# Patient Record
Sex: Male | Born: 1990 | Race: White | Hispanic: No | Marital: Single | Smoking: Current every day smoker
Health system: Southern US, Community
[De-identification: ages and names within clinical notes are randomized; demographics above are authoritative.]

## PROBLEM LIST (undated history)

## (undated) DIAGNOSIS — N4340 Spermatocele of epididymis, unspecified: Secondary | ICD-10-CM

## (undated) DIAGNOSIS — I499 Cardiac arrhythmia, unspecified: Secondary | ICD-10-CM

## (undated) HISTORY — DX: Spermatocele of epididymis, unspecified: N43.40

## (undated) HISTORY — PX: CARDIAC ELECTROPHYSIOLOGY STUDY AND ABLATION: SHX1294

---
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
## 2010-06-16 ENCOUNTER — Emergency Department (HOSPITAL_COMMUNITY): Admission: EM | Admit: 2010-06-16 | Discharge: 2010-06-16 | Payer: Self-pay | Admitting: Emergency Medicine

## 2010-07-04 ENCOUNTER — Emergency Department (HOSPITAL_COMMUNITY): Admission: EM | Admit: 2010-07-04 | Discharge: 2010-07-04 | Payer: Self-pay | Admitting: Emergency Medicine

## 2010-08-04 ENCOUNTER — Encounter (INDEPENDENT_AMBULATORY_CARE_PROVIDER_SITE_OTHER): Payer: Self-pay | Admitting: Internal Medicine

## 2010-08-04 ENCOUNTER — Ambulatory Visit: Payer: Self-pay | Admitting: Nurse Practitioner

## 2010-08-04 DIAGNOSIS — K047 Periapical abscess without sinus: Secondary | ICD-10-CM

## 2010-08-04 DIAGNOSIS — N4340 Spermatocele of epididymis, unspecified: Secondary | ICD-10-CM

## 2010-08-04 HISTORY — DX: Spermatocele of epididymis, unspecified: N43.40

## 2010-09-19 ENCOUNTER — Encounter: Payer: Self-pay | Admitting: Surgery

## 2010-09-28 NOTE — Assessment & Plan Note (Signed)
Summary: NEW PT//MC   Vital Signs:  Patient profile:   20 year old male Height:      70 inches Weight:      153.4 pounds BMI:     22.09 BSA:     1.87 Temp:     97 degrees F oral Pulse rate:   80 / minute Pulse rhythm:   regular Resp:     20 per minute BP sitting:   122 / 70  (left arm) Cuff size:   regular  Vitals Entered By: Gaylyn Cheers RN (August 04, 2010 11:00 AM) CC: here to establish, has been to ER twice for infected tooth rt lower molar, jaw cracking causing pain worse when he is eating Is Patient Diabetic? No Pain Assessment Patient in pain? yes     Location: mouth Intensity: 7 Type: aching Onset of pain  Constant  Does patient need assistance? Functional Status Self care Ambulation Normal   CC:  here to establish, has been to ER twice for infected tooth rt lower molar, and jaw cracking causing pain worse when he is eating.  History of Present Illness: 20 yo male here to establish.  Concerns:  1.  Tooth pain:  Right upper jaw.  States has been to ED twice and treated with PCn, then Clindamycin.  Has a lot of facial swelling with this and fever.  Gets better with abx, but then the swelling and pain recur.  States he is starting to feel pain and swelling again since last treatment about 1 month ago.  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  1999:  PS[facility] ablation at [facility]  Family History: Mother, 40:  Depression, Polysubstance abuse. Father, 79:  Heatlhy as far as pt. knows Sister, 7:  Heathly No children  Social History: Lives with Maternal aunt, Worthy Rancher, her husband and 2 children.  No Significant other Not employed Not sexually active  Mother Judie Bonus) was discharged from our practice  for polysubstance abuse & drug seeking - she is also living with them now that she is out of jail; Smokers in home.   Enjoys Nintendo, skateboarding Tobacco:  started age 34.  1ppd Alcohol:  Once every 2 months--never to excess Drug:  No  Physical Exam  General:  NAD Eyes:  No corneal or conjunctival inflammation noted. EOMI. Perrla. Funduscopic exam benign, without hemorrhages, exudates or papilledema. Vision grossly normal. Ears:  External ear exam shows no significant lesions or deformities.  Otoscopic examination reveals clear canals, tympanic membranes are intact bilaterally without bulging, retraction, inflammation or discharge. Hearing is grossly normal bilaterally. Mouth:  Significant dental decay diffusely--most of molars with quite large cavities.  Teeth broken off at gumline on right, both upper and lower or with deep cavities.  Gingiva swollen and red diffusely. Neck:  No deformities, masses, or tenderness noted. Lungs:  Normal respiratory effort, chest expands symmetrically. Lungs are clear to auscultation, no crackles or wheezes. Heart:  Normal rate and regular rhythm. S1 and S2 normal without gallop, murmur, click, rub or other extra sounds.   Impression & Recommendations:  Problem # 1:  ABSCESS, TOOTH (ICD-522.5) Clindamycin--this worked best for pt. 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 1 cap by  mouth three times a day for 10 days  Patient Instructions: 1)  Referral to Dentist--abscessed tooth 2)  Follow up with Dr. Delrae Alfred in 4 months --CPE 3)  Ibuprofen for pain 600-800 mg every 6 hours with food Prescriptions: CLINDAMYCIN HCL 300 MG CAPS (CLINDAMYCIN HCL) 1 cap by mouth three times a day for 10 days  #30 x 0   Entered and Authorized by:   Julieanne Manson MD   Signed by:   Julieanne Manson MD on 08/04/2010   Method used:   Faxed to ...       [facility] (retail)       [address] [place], [place]  [address]       Ph: [phone]       Fax: [phone]   RxID:   [id number]    Orders Added: 1)  Dental Referral [Dentist] 2)  New Patient Level II [phone]

## 2010-09-30 NOTE — Letter (Signed)
Summary: DENTAL REFERRAL  DENTAL REFERRAL   Imported By: Arta Bruce 08/10/2010 14:10:10  _____________________________________________________________________  External Attachment:    Type:   Image     Comment:   External Document

## 2011-01-14 NOTE — Discharge Summary (Signed)
NAME:  Daryl Harrison, CUCCARO NO.:  [id number]   MEDICAL RECORD NO.:  [id number]          PATIENT TYPE:  INP   LOCATION:  6125                         FACILITY:  [facility]   PHYSICIAN:  Wayne A. Sheffield Slider, M.D.    DATE OF BIRTH:  04-Apr-1991   DATE OF ADMISSION:  12/23/2004  DATE OF DISCHARGE:  12/24/2004                                 DISCHARGE SUMMARY   DISCHARGE MEDICATIONS:  None.   DISCHARGE DIAGNOSIS:  Pneumothorax.   BRIEF HISTORY AND HOSPITAL COURSE:  This is a 20 year old white male with  history of pediatric cardiology procedures (records not available, possible  Wolff-Parkinson-White at age 79-24 years old) came to the ER complaining of  shortness of breath and chest pain in the left upper hemithorax during the  morning of April 27.  The patient also complained of left shoulder pain.  The patient felt that he was having a heart attack.  Pain and shortness of  breath decreased gradually, while he was in the emergency room.  By the time  of interview, the patient was completely free of chest pain, shortness of  breath, or cough.  The patient was comfortable on room air.  The patient  denies previous history of pneumothorax.   PHYSICAL EXAMINATION:  VITALS:  Temperature 98.2, respiratory rate 18, pulse  91, blood pressure 108/63, O2 saturation 99% on room air.  GENERAL:  The patient was in no acute distress, comfortable.  CARDIOVASCULAR SYSTEM:  Unremarkable.  RESPIRATORY SYSTEM:  Clear to auscultation bilaterally, with good air  movement in all fields.  No retractions or symptoms of respiratory distress.  Physical examination was otherwise unremarkable.   LABORATORY DATA:  Chest x-ray showed less than 10% left apical pneumothorax.  Urine drug screen was negative, alcohol level less than 5.  Sodium 138,  potassium 3.3, chloride 101, bicarbonate 27.9, BUN 9, creatinine 0.9,  glucose 126.  Venous blood gases:  pH 7.447, PCO2 40.  Hemoglobin 17.3,  hematocrit 50.1.   PROBLEM LIST:  1.  Pneumothorax.  The patient was stable on admission.  CVTS was consulted,      who advised to admit the patient for observation.  A repeat a chest x-      ray in six hours showed a slight interval increase in the left-sided      pneumothorax which is in the 10-15% range.  On April 28 a.m., a repeat      chest x-ray showed a small stable left apical pneumothorax.  The patient      was clinically stable.  No shortness of breath, chest pain, O2      saturation was 98% on room air.  CVTS and teaching service agree with      discharge home for this patient with followup in one week by Dr. Laneta Simmers      (CVTS).  The patient will avoid exercise or strenuous activity.  The      patient will also follow up within two to three weeks with Dr. Leitha Schuller      at [facility] (PCP).  A follow up chest x-ray  will be      requested by Dr. Laneta Simmers during the next appointment on Jan 04, 2005.   1.  Social.  This acute presentation of chest pain and shortness of breath      secondary to pneumothorax occurred to this patient while he was smoking      marijuana.  The patient admitted to smoking marijuana for two to three      years now.  Per the patient, he uses maybe once a week.  The patient      also admitted to use Ritalin some times.  The patient denies alcohol      abuse or smoking.  Social worker was consulted.  This patient has told      me and the social worker that he has decided to quit smoking marijuana,      since after this acute episode he realized how dangerous this could be.      Although this patient refuses any therapy or support for quitting      marijuana.  The patient states he will do it by himself.  Social worker      has to provide information to the mother in case that the patient      changes his mind and agrees to receive support.   DISCHARGE CONDITION:  Stable.   CONSULTS:  CVTS (Dr. Laneta Simmers).   FOLLOWUP:  1.  Dr. Laneta Simmers on Jan 04, 2005 at 11:15 a.m.  at [address]  2.  Dr. Leitha Schuller at [facility] on May 16 at 12:15 p.m.      FIM/MEDQ  D:  12/24/2004  T:  12/25/2004  Job:  [id number]   cc:   Evelene Croon, M.D.  [address]  [place]  [place] [address]  Fax: [phone]   Franchot Mimes, MD  Fax: [phone]

## 2011-01-14 NOTE — H&P (Signed)
NAMEMARKY, BURESH NO.:  [id number]   MEDICAL RECORD NO.:  [id number]          PATIENT TYPE:  EMS   LOCATION:  MAJO                         FACILITY:  [facility]   PHYSICIAN:  Dwana Curd. Para March, M.D. DATE OF BIRTH:  1991-07-07   DATE OF ADMISSION:  12/23/2004  DATE OF DISCHARGE:                                HISTORY & PHYSICAL   CHIEF COMPLAINT:  Shortness of breath.   HISTORY OF PRESENT ILLNESS:  The patient is a 20 year old white male who had  sudden onset of shortness of breath and chest pain in the left upper  hemithorax this a.m. at about 7:30.  He also noted left shoulder pain.  The  patient said it felt like I was having a heart attack.  He notified his  mother, and he was then transported to [facility]  Emergency Room via EMS.  The pain and shortness of breath decreased  gradually while he was in the emergency room, and at the time of the  interview he was almost completely free of chest pain.  He was having no  shortness of breath and no cough.  The patient was comfortable on room air.  He had no previous history of a pneumothorax.   PAST MEDICAL HISTORY:  Notable for a history of a pediatric cardiologic  procedure.  Records are not available at the time of dictation.  The patient  is unsure of the name of the procedure.  However, it sounds like the patient  had been followed by Dr. Candis Musa and had had an ablation/catheterization for  Wolff-Parkinson-White at age 49-57 years of age.  The patient was sure that it  was a tachyarrhythmia that he had previously had trouble with.  However,  since his ablation, he has not had an palpitations, syncope, or presyncope.  He has had no troubles that he knows of.   SOCIAL HISTORY:  He lives with his mother and his grandmother.  His mother  is recently home from jail.  His father is in jail currently.  The patient  has one sister.  She lives with his aunt.  He is in ninth grade.  He is not  doing well  in school.  He does use marijuana.  He admitted to his last use  on December 16, 2004.  He has also used friends' Ritalin on occasion.  He is  not using alcohol.  He is not using tobacco.  The patient enjoys  skateboarding.   FAMILY HISTORY:  Mother and father are alive and healthy.  Grandparent does  have a sibling with diabetes.   MEDICATIONS:  None.  The patient did use Ritalin approximately two weeks  ago.   ALLERGIES:  No known drug allergies.   REVIEW OF SYSTEMS:  No cough, no fevers, no chills, no rhinorrhea, no rash,  no nausea or vomiting, no diarrhea, no constipation, no dysuria, no  arthralgias, no myalgias.   PHYSICAL EXAMINATION:  VITAL SIGNS:  Temperature 98.2; respiratory rate 18;  pulse 91; blood pressure 108/63; O2 saturation 99% on room air.  GENERAL:  No apparent  distress.  Comfortable.  HEENT:  The patient is wearing pink eyeliner.  He has tympanic membrane  within normal limits bilaterally.  Pupils equal, round, and reactive to  light.  Extraocular movements intact.  Oropharynx within normal limits.  The  patient does not have any oropharyngeal erythema.  NECK:  No lymphadenopathy.  No thyromegaly.  CARDIOVASCULAR:  Regular rate and rhythm.  No murmurs, rubs, or gallops  appreciated.  The patient does have normal S1 and S2.  RESPIRATORY:  Clear to auscultation bilaterally, with good air movement in  all fields.  He does not have any retractions or signs or symptoms of  respiratory distress.  ABDOMEN:  Soft, nontender, nondistended.  Positive bowel sounds.  EXTREMITIES:  No cyanosis, clubbing, or edema.  Positive rapid capillary  refill.  SKIN:  Multiple abrasions.  The patient states that these are all from  skateboarding.  They are in various stages of healing.   LABORATORIES:  Urine drug screen negative.  Alcohol level less than 5.  I-  stat labs:  Sodium 138, potassium 3.3, chloride 101, bicarbonate 27.9, BUN  9, creatinine 0.9, glucose 126.  Venous blood  gas:  pH 7.447, pCO2 40.  Hemoglobin 17.3, hematocrit 50.1.  Chest x-ray:  Less than 10% left apical  pneumothorax demonstrated.   ASSESSMENT AND PLAN:  The patient is a 20 year old male:  1.  Pneumothorax.  The patient is stable now, and he is being admitted per      the opinion of CVTS for observation.  Repeat a chest x-ray in six hours.      If the patient is stable, we will repeat the chest x-ray again in the      morning.  In the meantime, will order continuous pulse oximetry.  He has      no need for a chest tube now, and he has no need for supplemental oxygen      right now.  If the patient remains stable overnight, and the      pneumothorax is not enlarged on either of the subsequent chest x-rays,      the patient may be discharged tomorrow with followup per CVTS.  2.  History of a cardiac disorder.  I will attempt to get old records for      this.  However, the patient appears to be stable.  3.  Social work consult, because the parents and in and out of jail.  The      patient also admits to drug use and school difficulty.  Will also get      psychological consult per Dr. Lindie Spruce.  The patient is aware of both of      these.  4.  DVT prophylaxis.  Out of bed t.i.d., with activity as tolerated.  5.  FEN/GI.  Regular diet, and Hep-lock IV.      GSD/MEDQ  D:  12/23/2004  T:  12/23/2004  Job:  [id number]

## 2011-11-23 ENCOUNTER — Emergency Department (HOSPITAL_COMMUNITY)
Admission: EM | Admit: 2011-11-23 | Discharge: 2011-11-23 | Disposition: A | Discharge status: Home / Self Care | Payer: Self-pay | Attending: Emergency Medicine | Admitting: Emergency Medicine

## 2011-11-23 ENCOUNTER — Encounter (HOSPITAL_COMMUNITY): Payer: Self-pay | Admitting: *Deleted

## 2011-11-23 ENCOUNTER — Other Ambulatory Visit: Payer: Self-pay

## 2011-11-23 DIAGNOSIS — I498 Other specified cardiac arrhythmias: Secondary | ICD-10-CM | POA: Insufficient documentation

## 2011-11-23 DIAGNOSIS — R000 Tachycardia, unspecified: Secondary | ICD-10-CM

## 2011-11-23 DIAGNOSIS — R002 Palpitations: Secondary | ICD-10-CM | POA: Insufficient documentation

## 2011-11-23 HISTORY — DX: Cardiac arrhythmia, unspecified: I49.9

## 2011-11-23 LAB — CBC
HCT: 45.7 % (ref 39.0–52.0)
Hemoglobin: 16.7 g/dL (ref 13.0–17.0)
MCH: 31.9 pg (ref 26.0–34.0)
MCHC: 36.5 g/dL — ABNORMAL HIGH (ref 30.0–36.0)
MCV: 87.2 fL (ref 78.0–100.0)
Platelets: 254 10*3/uL (ref 150–400)
RBC: 5.24 MIL/uL (ref 4.22–5.81)
RDW: 12.3 % (ref 11.5–15.5)
WBC: 13.7 10*3/uL — ABNORMAL HIGH (ref 4.0–10.5)

## 2011-11-23 LAB — BASIC METABOLIC PANEL
BUN: 11 mg/dL (ref 6–23)
CO2: 22 mEq/L (ref 19–32)
Calcium: 9 mg/dL (ref 8.4–10.5)
Chloride: 102 mEq/L (ref 96–112)
Creatinine, Ser: 0.96 mg/dL (ref 0.50–1.35)
GFR calc Af Amer: >90 mL/min (ref >90)
GFR calc non Af Amer: >90 mL/min (ref >90)
Glucose, Bld: 127 mg/dL — ABNORMAL HIGH (ref 70–99)
Potassium: 3.4 mEq/L — ABNORMAL LOW (ref 3.5–5.1)
Sodium: 139 mEq/L (ref 135–145)

## 2011-11-23 LAB — D-DIMER, QUANTITATIVE: D-Dimer, Quant: <0.22 ug/mL-FEU (ref 0.00–0.48)

## 2011-11-23 LAB — MAGNESIUM: Magnesium: 1.4 mg/dL — ABNORMAL LOW (ref 1.5–2.5)

## 2011-11-23 MED ORDER — SODIUM CHLORIDE 0.9 % IV BOLUS (SEPSIS)
2000.0000 mL | Freq: Once | INTRAVENOUS | Status: AC
  Administered 2011-11-23: 2000 mL via INTRAVENOUS

## 2011-11-23 MED ORDER — GI COCKTAIL ~~LOC~~
30.0000 mL | Freq: Once | ORAL | Status: AC
  Administered 2011-11-23: 30 mL via ORAL
  Filled 2011-11-23: qty 30

## 2011-11-23 MED ORDER — MAGNESIUM SULFATE 40 MG/ML IJ SOLN
2.0000 g | Freq: Once | INTRAMUSCULAR | Status: AC
  Administered 2011-11-23: 2 g via INTRAVENOUS
  Filled 2011-11-23: qty 50

## 2011-11-23 NOTE — Discharge Instructions (Signed)
Palpitations  A palpitation is the feeling that your heartbeat is irregular. It may also feel like your heart is beating faster than normal.  HOME CARE To help prevent palpitations:  Do not drink caffeine or alcohol.   Do not eat chocolate.   Do not smoke.   Reduce stress. Try yoga or meditation.   Swim, jogg, or walk.  GET HELP RIGHT AWAY IF:   There is chest pain.   You have shortness of breath.   You deveop a very bad headache.   You develop dizziness or fainting.   You continue to have a fast heartbeat.   The palpitations occur more often.  MAKE SURE YOU:   Understand these instructions.   Will watch your condition.   Will get help right away if you are not doing well or get worse.  Document Released: 05/24/2008 Document Revised: 08/04/2011 Document Reviewed: 05/24/2008 [facility] Patient Information 2012 [place], [place].Tachycardia, Nonspecific In adults, the heart normally beats between 60 and 100 times a minute. A heart rate over 100 is called tachycardia. When your heart beats too fast, it may not be able to pump enough blood to the rest of the body. CAUSES   Exercise or exertion.   Fever.   Pain or injury.   Infection.   Loss of fluid (dehydration).   Overactive thyroid.   Lack of red blood cells (anemia).   Anxiety.   Alcohol.   Heart arrhythmia.   Caffeine.   Tobacco products.   Diet pills.   Street drugs.   Heart disease.  SYMPTOMS  Palpitations (rapid or irregular heartbeat).   Dizziness.   Tiredness (fatigue).   Shortness of breath.  DIAGNOSIS  After an exam and taking a history, your caregiver may order:  Blood tests.   Electrocardiogram (EKG).   Heart monitor.  TREATMENT  Treatment will depend on the cause and potential for harm. It may include:  Intravenous (IV) replacement of fluids or blood.   Antidote or reversal medicines.   Changes in your present medicines.   Lifestyle changes.  HOME CARE INSTRUCTIONS     Get rest.   Drink enough water and fluids to keep your urine clear or pale yellow.   Avoid:   Caffeine.   Nicotine.   Alcohol.   Stress.   Chocolate.   Stimulants.   Only take medicine as directed by your caregiver.  SEEK IMMEDIATE MEDICAL CARE IF:   You have pain in your chest, upper arms, jaw, or neck.   You become weak, dizzy, or feel faint.   You have palpitations that will not go away.   You throw up (vomit), have diarrhea, or pass blood.   You look pale and your skin is cool and wet.  MAKE SURE YOU:   Understand these instructions.   Will watch your condition.   Will get help right away if you are not doing well or get worse.  Document Released: 09/22/2004 Document Revised: 08/04/2011 Document Reviewed: 07/26/2011 [facility] Patient Information 2012 [place], [place].  RESOURCE GUIDE  Dental Problems  Patients with Medicaid: [facility] [phone] [address]                                           [phone] [address]  [employer] Phone:  [phone]                                                  Phone:  [phone]  If unable to pay or uninsured, contact:  [facility] or [facility] to become qualified for the adult dental clinic.  Chronic Pain Problems Contact [facility]  [phone] Patients need to be referred by their primary care doctor.  Insufficient Money for Medicine Contact [employer]:  call "211" or [facility] 330-357-2079.  No Primary Care Doctor Call [facility]  [phone] Other agencies that provide inexpensive medical care    [facility]  [phone]    [facility]  [phone]    [facility]  [phone]    [facility]  [phone]    [facility]  [phone]    [facility]  [phone]  Psychological Services [facility]  [phone] [facility]  [phone] [facility]    [phone] (emergency services 7571357435)  Substance Abuse Resources [facility]  850-332-3133 [facility] 207-771-7806 [facility] [phone] Floydene Flock [phone] Residential & Outpatient Substance Abuse Program  [phone]  Abuse/Neglect [facility] Child Abuse Hotline 9094621863 [facility] Child Abuse Hotline 618-624-8595 (After Hours)  Emergency Shelter [facility] [phone]  Maternity [facility] [facility] [phone] [facility] Alert Services [phone]  MRSA Hotline #:   770 245 2308    [facility] Resources  [facility] [place] [facility]     [employer]                          [facility] [address] [place]                       [address]      [address] [place] [address]  Blondell Reveal Phone:  [phone]                                   Phone:  [phone]                 Phone:  [phone]  [facility] Phone:  [phone]  [facility] Child Abuse Hotline 850 337 2507 [phone] (After Hours)

## 2011-11-23 NOTE — ED Notes (Signed)
Per EMS pt reports drinking ETOH last night, pt did not specify amt.

## 2011-11-23 NOTE — ED Notes (Signed)
Received report from [facility]. Pt is currently without any respiratory or cardiac distress. He is fully alert and oriented. Pt is not having any CP. Pt stated that he came to the ED because his heart rate started beating fast. Per pt, HR went up to 160s. Currently, HR is 120s. Pt complaining of indigestion. EDP made aware. Will continue to monitor.

## 2011-11-23 NOTE — ED Notes (Signed)
Pt in per [facility] from home pt c/o fast heart beat onset today @ 17:30, pt has hx of cardiac ablation 15 yrs ago d/t irregular heart rhythm, pt denies CP, SOB, pt denies N/V/D, pt c/o dizziness upon standing, pt c/o indigestion for a couple months

## 2011-11-23 NOTE — ED Provider Notes (Signed)
History    21 year old male palpitations. Acute onset about an hour prior to arrival. Patient has his heart is beating fast. No chest pain or shortness of breath. Patient reports an ablation at age of 49. Is unsure of specifics and neither are his parents. Has not had any issue since then. Occasionally smokes marijuana but denies any other drug use. Patient states that he drank a significant amount of alcohol last night as well. No fevers or chills. No cough. Denies history of blood clot. No unusual leg pain or swelling. Denies history of thyroid issues.   CSN: [id number]  Arrival date & time 11/23/11  1847   First MD Initiated Contact with Patient 11/23/11 1919      Chief Complaint  Patient presents with  . Irregular Heart Beat    (Consider location/radiation/quality/duration/timing/severity/associated sxs/prior treatment) HPI  Past Medical History  Diagnosis Date  . Irregular heart beat     Past Surgical History  Procedure Date  . Cardiac electrophysiology study and ablation     History reviewed. No pertinent family history.  History  Substance Use Topics  . Smoking status: Former Games developer  . Smokeless tobacco: Not on file  . Alcohol Use: Yes      Review of Systems   Review of symptoms negative unless otherwise noted in HPI.   Allergies  Review of patient's allergies indicates no known allergies.  Home Medications  No current outpatient prescriptions on file.  BP 124/90  Pulse 120  Temp(Src) 97.5 F (36.4 C) (Oral)  Resp 22  SpO2 100%  Physical Exam  Nursing note and vitals reviewed. Constitutional: He appears well-developed and well-nourished. No distress.  HENT:  Head: Normocephalic and atraumatic.  Eyes: Conjunctivae are normal. Pupils are equal, round, and reactive to light. Right eye exhibits no discharge. Left eye exhibits no discharge.  Neck: Neck supple.  Cardiovascular: Regular rhythm and normal heart sounds.  Exam reveals no gallop and no  friction rub.   No murmur heard.      Mildly tachycardic with a regular  Pulmonary/Chest: Effort normal and breath sounds normal. No respiratory distress.  Abdominal: Soft. He exhibits no distension. There is no tenderness.  Musculoskeletal: He exhibits no edema and no tenderness.  Neurological: He is alert.  Skin: Skin is warm and dry.  Psychiatric: He has a normal mood and affect. His behavior is normal. Thought content normal.      ED Course  Procedures (including critical care time)  Labs Reviewed  CBC - Abnormal; Notable for the following:    WBC 13.7 (*)    MCHC 36.5 (*)    All other components within normal limits  BASIC METABOLIC PANEL - Abnormal; Notable for the following:    Potassium 3.4 (*)    Glucose, Bld 127 (*)    All other components within normal limits  MAGNESIUM - Abnormal; Notable for the following:    Magnesium 1.4 (*)    All other components within normal limits  TSH  D-DIMER, QUANTITATIVE  LAB REPORT - SCANNED   No results found.   1. Palpitations   2. Sinus tachycardia       MDM  21 year old male palpitations. Patient with sinus tachycardia in the 100s to 120s. Possibly related to dehydration given significant alcohol ingestion last night. No evidence of atrial fibrillation or other dysrhythmia on EKG, monitor or my examination. EKG with narrow complex tachycardia with associated regular atrial activity, normal PR interval and QT. Improved with IVF. Mild hypomag but doubt  related. Repleted. Return precautions dicussed. Outtp fu.        Raeford Razor, MD 11/30/11 [phone]

## 2011-11-24 LAB — TSH: TSH: 1.851 u[IU]/mL (ref 0.350–4.500)

## 2012-05-23 ENCOUNTER — Emergency Department (INDEPENDENT_AMBULATORY_CARE_PROVIDER_SITE_OTHER)
Admission: EM | Admit: 2012-05-23 | Discharge: 2012-05-23 | Disposition: A | Discharge status: Home / Self Care | Payer: Self-pay | Source: Home / Self Care | Attending: Emergency Medicine | Admitting: Emergency Medicine

## 2012-05-23 ENCOUNTER — Encounter (HOSPITAL_COMMUNITY): Payer: Self-pay | Admitting: Emergency Medicine

## 2012-05-23 DIAGNOSIS — R05 Cough: Secondary | ICD-10-CM

## 2012-05-23 DIAGNOSIS — R0981 Nasal congestion: Secondary | ICD-10-CM

## 2012-05-23 DIAGNOSIS — R059 Cough, unspecified: Secondary | ICD-10-CM

## 2012-05-23 DIAGNOSIS — J3489 Other specified disorders of nose and nasal sinuses: Secondary | ICD-10-CM

## 2012-05-23 DIAGNOSIS — L259 Unspecified contact dermatitis, unspecified cause: Secondary | ICD-10-CM

## 2012-05-23 MED ORDER — GUAIFENESIN-CODEINE 100-10 MG/5ML PO SYRP: 5.0000 mL | ORAL_SOLUTION | Freq: Four times a day (QID) | ORAL | Status: DC | PRN

## 2012-05-23 MED ORDER — TRIAMCINOLONE ACETONIDE 0.025 % EX OINT: TOPICAL_OINTMENT | Freq: Two times a day (BID) | CUTANEOUS | Status: DC

## 2012-05-23 NOTE — ED Notes (Signed)
Multiple complaints.  Chest congestion onset yesterday.  Reports runny nose, drainage in back of throat, and cough.  Reports cough is persistent.  Reports congestion is white-brown.  Patient also concerned for what patient reports as bumps on right hand that peel.

## 2012-05-23 NOTE — ED Provider Notes (Signed)
History     CSN: [id number]  Arrival date & time 05/23/12  4098   First MD Initiated Contact with Patient 05/23/12 [phone]      Chief Complaint  Patient presents with  . URI    (Consider location/radiation/quality/duration/timing/severity/associated sxs/prior treatment) HPI Comments: Patient presents urgent care complaining of new onset of cough, nasal congestion and mild sore throat since yesterday. Patient describes a runny and congested nose some drainage in the back of his throat with some dry cough. Have expectorated some white-brown phlegm occasionally. Patient denies any wheezing, shortness of breath, constitutional symptoms such as fevers, arthralgias myalgias or headaches.  Patient also presents with a recurrent intermittent not constant scaly eruptions on both of his hands he thinks he might be related to his work as he works with some chemicals but has had it before has some on his sole surface of both of his feet. He seemed to go away for their own. Itches at times. Patient denies any further associated symptoms with them as fevers, myalgias, anorexia, arthralgias,. Patient also denies any rashes on any other area of his body including torso back and dorsal aspect of his hands.  Patient is a 21 y.o. male presenting with URI and rash. The history is provided by the patient.  URI The primary symptoms include sore throat, cough and rash. Primary symptoms do not include fever, fatigue, headaches, nausea, vomiting, myalgias or arthralgias. The current episode started yesterday. This is a new problem.  The rash is associated with itching. The rash is not associated with blisters or weeping.   Symptoms associated with the illness include chills, sinus pressure, congestion and rhinorrhea. The illness is not associated with plugged ear sensation or facial pain.  Rash  This is a recurrent problem. The problem has not changed since onset.There has been no fever. The pain is mild. Associated  symptoms include itching. Pertinent negatives include no blisters, no pain and no weeping. He has tried nothing for the symptoms. The treatment provided no relief.    Past Medical History  Diagnosis Date  . Irregular heart beat     Past Surgical History  Procedure Date  . Cardiac electrophysiology study and ablation     No family history on file.  History  Substance Use Topics  . Smoking status: Current Every Day Smoker  . Smokeless tobacco: Not on file  . Alcohol Use: Yes      Review of Systems  Constitutional: Positive for chills. Negative for fever, diaphoresis, activity change, fatigue and unexpected weight change.  HENT: Positive for congestion, sore throat, rhinorrhea, postnasal drip and sinus pressure. Negative for neck pain and neck stiffness.   Eyes: Negative for photophobia.  Respiratory: Positive for cough.   Gastrointestinal: Negative for nausea, vomiting and diarrhea.  Musculoskeletal: Negative for myalgias and arthralgias.  Skin: Positive for itching and rash. Negative for color change and pallor.  Neurological: Negative for dizziness and headaches.    Allergies  Review of patient's allergies indicates no known allergies.  Home Medications   Current Outpatient Rx  Name Route Sig Dispense Refill  . OVER THE COUNTER MEDICATION  Cough drops    . GUAIFENESIN-CODEINE 100-10 MG/5ML PO SYRP Oral Take 5 mLs by mouth 4 (four) times daily as needed for cough or congestion. 120 mL 0  . TRIAMCINOLONE ACETONIDE 0.025 % EX OINT Topical Apply topically 2 (two) times daily. X 7 days affected  areas 30 g 0    BP 139/83  Pulse 110  Temp  98 F (36.7 C) (Oral)  Resp 18  SpO2 100%  Physical Exam  Nursing note and vitals reviewed. Constitutional: He appears well-developed and well-nourished.  HENT:  Head: Normocephalic.  Right Ear: Tympanic membrane normal.  Left Ear: Tympanic membrane normal.  Mouth/Throat: Uvula is midline, oropharynx is clear and moist and  mucous membranes are normal.  Eyes: Conjunctivae normal are normal. Right eye exhibits no discharge. Left eye exhibits no discharge.  Neck: Neck supple.  Cardiovascular: Normal rate.  Exam reveals no gallop and no friction rub.   No murmur heard. Pulmonary/Chest: Effort normal and breath sounds normal. No respiratory distress. He has no wheezes. He has no rales. He exhibits no tenderness.  Abdominal: Soft.  Skin: Skin is warm. Rash noted. No abrasion and no purpura noted. Rash is papular and urticarial. Rash is not nodular and not vesicular. No erythema.       ED Course  Procedures (including critical care time)  Labs Reviewed - No data to display No results found.   1. Cough   2. Nasal congestion   3. Contact dermatitis       MDM   Symptoms and signs consistent with early stages of a upper respiratory process, possibly viral. Patient was prescribed cervical spine guaifenesin for cough and congestion. Otherwise about symptoms and will require further evaluation for his return. Patient agree with treatment plan followup care as necessary. Also patient with a localized scaly erythematous rash consistent with contact dermatitis both hands (palmar surface)- prescription of transluminal ointment was prescribed to use limited for 7 days       Jimmie Molly, MD 05/23/12 1049

## 2016-06-09 ENCOUNTER — Encounter (HOSPITAL_COMMUNITY): Payer: Self-pay | Admitting: Medical

## 2016-06-09 ENCOUNTER — Emergency Department (HOSPITAL_COMMUNITY)
Admission: EM | Admit: 2016-06-09 | Discharge: 2016-06-09 | Disposition: A | Discharge status: Home / Self Care | Payer: Self-pay | Attending: Emergency Medicine | Admitting: Emergency Medicine

## 2016-06-09 ENCOUNTER — Encounter (HOSPITAL_COMMUNITY): Payer: Self-pay | Admitting: Emergency Medicine

## 2016-06-09 DIAGNOSIS — F17200 Nicotine dependence, unspecified, uncomplicated: Secondary | ICD-10-CM | POA: Insufficient documentation

## 2016-06-09 DIAGNOSIS — S39012A Strain of muscle, fascia and tendon of lower back, initial encounter: Secondary | ICD-10-CM | POA: Insufficient documentation

## 2016-06-09 DIAGNOSIS — Y9389 Activity, other specified: Secondary | ICD-10-CM | POA: Insufficient documentation

## 2016-06-09 DIAGNOSIS — X500XXA Overexertion from strenuous movement or load, initial encounter: Secondary | ICD-10-CM | POA: Insufficient documentation

## 2016-06-09 DIAGNOSIS — S29012A Strain of muscle and tendon of back wall of thorax, initial encounter: Secondary | ICD-10-CM

## 2016-06-09 DIAGNOSIS — Y990 Civilian activity done for income or pay: Secondary | ICD-10-CM | POA: Insufficient documentation

## 2016-06-09 DIAGNOSIS — Y9289 Other specified places as the place of occurrence of the external cause: Secondary | ICD-10-CM | POA: Insufficient documentation

## 2016-06-09 MED ORDER — CYCLOBENZAPRINE HCL 10 MG PO TABS: 10.0000 mg | ORAL_TABLET | Freq: Every evening | ORAL | 0 refills | Status: DC | PRN

## 2016-06-09 NOTE — ED Notes (Signed)
See pa assessment 

## 2016-06-09 NOTE — ED Triage Notes (Signed)
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 PT denies any numbness/ weakness in arms or legs.

## 2016-06-09 NOTE — Discharge Instructions (Signed)
Use ice or heat for 20 minutes at a time, several times a day You can get lidocaine patches at a pharmacy to apply to the area Take Ibuprofen 600mg  three times daily for the next week for pain and inflammation Take the muscle relaxer at bedtime - it makes you drowsy so do not take before driving or work

## 2016-06-09 NOTE — ED Provider Notes (Signed)
[facility]-EMERGENCY DEPT Provider Note   CSN: [id number] Arrival date & time: 06/09/16  1108   By signing my name below, I, Daryl Harrison, attest that this documentation has been prepared under the direction and in the presence of Daryl Hart, PA-C. Electronically Signed: Teofilo Harrison, ED Scribe. 06/09/2016. 11:37 AM.   History   Chief Complaint Chief Complaint  Patient presents with  . Back Pain    The history is provided by the patient. No language interpreter was used.   HPI Comments:  Daryl Harrison is a 25 y.o. male who presents to the Emergency Department complaining of constant back pain x 3 weeks. 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 Pt notes that the pain is non-radiating and is primarily located in the upper left area of his back. Pt states that when he wakes up his back feels "tight," then improves minimally during the day, and then worsens at night. Pt complains of associated difficulty sleeping. Pt has taken ibuprofen 500mg  twice daily with no relief. Pt denies PMHx of cancer, IV drug use, and unintentional weight loss. Pt also denies numbness/tingling in legs/groin, neck pain, and bowel/bladder incontinence.  Past Medical History:  Diagnosis Date  . Irregular heart beat     Patient Active Problem List   Diagnosis Date Noted  . ABSCESS, TOOTH 08/04/2010  . 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 CARDIAC ELECTROPHYSIOLOGY STUDY AND ABLATION         Home Medications    Prior to Admission medications   Medication Sig Start Date End Date Taking? Authorizing Provider  guaiFENesin-codeine (ROBITUSSIN AC) 100-10 MG/5ML syrup Take 5 mLs by mouth 4 (four) times daily as needed for cough or congestion. 05/23/12   Daryl Molly, MD  OVER THE COUNTER MEDICATION Cough drops    Historical  Provider, MD  triamcinolone (KENALOG) 0.025 % ointment Apply topically 2 (two) times daily. X 7 days affected  areas 05/23/12   Daryl Molly, MD    Family History No family history on file.  Social History Social History  Substance Use Topics  . Smoking status: Current Every Day Smoker  . Smokeless tobacco: Not on file  . Alcohol use Yes     Allergies   Review of patient's allergies indicates no known allergies.   Review of Systems Review of Systems  Constitutional:       Positive for difficulty sleeping due to pain.  Musculoskeletal: Positive for back pain. Negative for neck pain.  Neurological: Negative for numbness.     Physical Exam Updated Vital Signs BP 152/95 (BP Location: Right Arm)   Pulse 106   Temp 98.1 F (36.7 C) (Oral)   Resp 18   Ht 5\' 11"  (1.803 m)   Wt 175 lb (79.4 kg)   SpO2 99%   BMI 24.41 kg/m   Physical Exam  Constitutional: He appears well-developed and well-nourished. No distress.  HENT:  Head: Normocephalic and atraumatic.  Eyes: Conjunctivae are normal.  Cardiovascular: Normal rate.   Pulmonary/Chest: Effort normal.  Abdominal: He exhibits no distension.  Musculoskeletal:  Upper back: No rash, deformity. Point tenderness with palpation of the left upper back. Strength: 5/5 in upper and lower extremities. Gait: Normal gait    Neurological: He is alert.  Skin: Skin is warm and dry.  Psychiatric: He has a normal mood and affect.  Nursing note and vitals reviewed.    ED Treatments / Results  DIAGNOSTIC STUDIES:  Oxygen Saturation is 99% on RA, normal by my interpretation.    COORDINATION OF CARE:  11:37 AM Will give ibuprofen and lidocaine patch, recommended ice/heat treatment, and will refer to an ortho. Discussed treatment plan with pt at bedside and pt agreed to plan.   Labs (all labs ordered are listed, but only abnormal results are displayed) Labs Reviewed - No data to display  EKG  EKG Interpretation None        Radiology No results found.  Procedures Procedures (including critical care time)  Medications Ordered in ED Medications - No data to display   Initial Impression / Assessment and Plan / ED Course  I have reviewed the triage vital signs and the nursing notes.  Pertinent labs & imaging results that were available during my care of the patient were reviewed by me and considered in my medical decision making (see chart for details).  Clinical Course   25 year old male with muscle strain. Patient is ambulatory.  No loss of bowel or bladder control.  No concern for cauda equina.  No fever, night sweats, weight loss, h/o cancer, IVDA, no recent procedure to back. No acute injury. Discussed pain control with NSAIDs, ice/heat, muscle relaxer, and ortho referral. Work note given. Appears safe for discharge at this time. Follow up as indicated in discharge paperwork.    Final Clinical Impressions(s) / ED Diagnoses   Final diagnoses:  Muscle strain of left upper back, initial encounter    New Prescriptions New Prescriptions   No medications on file   I personally performed the services described in this documentation, which was scribed in my presence. The recorded information has been reviewed and is accurate.    Daryl Born, PA-C 06/09/16 1348    Daryl Porter, MD 06/17/16 1350

## 2016-06-27 ENCOUNTER — Ambulatory Visit (INDEPENDENT_AMBULATORY_CARE_PROVIDER_SITE_OTHER): Payer: Self-pay

## 2016-06-27 ENCOUNTER — Encounter (INDEPENDENT_AMBULATORY_CARE_PROVIDER_SITE_OTHER): Payer: Self-pay | Admitting: Sports Medicine

## 2016-06-27 ENCOUNTER — Ambulatory Visit (INDEPENDENT_AMBULATORY_CARE_PROVIDER_SITE_OTHER): Payer: Self-pay | Admitting: Sports Medicine

## 2016-06-27 VITALS — BP 146/87 | HR 104 | Ht 71.0 in | Wt 170.0 lb

## 2016-06-27 DIAGNOSIS — M9902 Segmental and somatic dysfunction of thoracic region: Secondary | ICD-10-CM

## 2016-06-27 DIAGNOSIS — M25512 Pain in left shoulder: Secondary | ICD-10-CM

## 2016-06-27 DIAGNOSIS — M542 Cervicalgia: Secondary | ICD-10-CM

## 2016-06-27 MED ORDER — PREDNISONE 50 MG PO TABS: 50.0000 mg | ORAL_TABLET | Freq: Every day | ORAL | 0 refills | Status: AC

## 2016-06-27 MED ORDER — GABAPENTIN 300 MG PO CAPS: ORAL_CAPSULE | ORAL | 1 refills | Status: AC

## 2016-06-27 NOTE — Progress Notes (Addendum)
Daryl Harrison - 25 y.o. male MRN [id number]  Date of birth: 09-01-1990  Office Visit Note: Visit Date: 06/27/2016 PCP: Centricity User, MD (Inactive) Referred by: No ref. provider found  Subjective: Chief Complaint  Patient presents with  . upper back    seen in Emergency Department   HPI: 6 weeks of insidious onset left periscapular pain. No known focal injury. Pain does not radiate into the left upper extremity. The pain doesn't awaken him at night on a daily basis. Pain is described as a generalized tightness. Denies any numbness or tingling. He is tried ibuprofen & Flexeril with only minimal improvement. No prior neck spine issues. Has noted some limitations in range of motion of his neck. Radiating into the left upper sternal area. Pain awakens him & night. Pain is constant. Patient  states upper back pain for over a month.  Hurts on left side of neck radiating into left shoulder blade.      ROS Otherwise per HPI.  Assessment & Plan: Visit Diagnoses:  1. Acute pain of left shoulder   2. Neck pain   3. Segmental and somatic dysfunction of thoracic region     Plan: Findings:  Osteopathic manipulation to the thoracic spine today. Care was taken to avoid manipulating cervical spine given potential for radicular component. Medications as below. [facility] back & neck conditioning program provided. He will be allowed return to work in 2 days with restriction outlined per work note. If any lack of improvement would consider further advanced imaging of the cervical spine.   Meds & Orders:  Meds ordered this encounter  Medications  . gabapentin (NEURONTIN) 300 MG capsule    Sig: Start with 1 tab po qhs X 1 week, then increase to 1 tab po bid X 1 week then 1 tab po prn    Dispense:  90 capsule    Refill:  1  . predniSONE (DELTASONE) 50 MG tablet    Sig: Take 1 tablet (50 mg total) by mouth daily with breakfast. Start 10/30    Dispense:  5 tablet    Refill:  0    Orders Placed This  Encounter  Procedures  . OSTEOPATHIC MANIPULATION TREATMENT  . XR Cervical Spine 2 or 3 views    Follow-up: Return in about 6 weeks (around 08/08/2016) for repeat clinical exam.   Procedures: No procedures performed  PROCEDURE NOTE : Osteopathic Exam & Manipulation REGION: OSTEOPATHIC EXAM FINDINGS  THORACIC: T2 - T8 F RISI and N RrSl   The decision today to treat with Osteopathic Manipulative Therapy (OMT) was based on physical exam findings. Verbal consent was obtained after after explanation of risks, benefits and potential side effects, including acute pain flare, post manipulation soreness and need for repeat treatments.  If Cervical manipulation was performed additional time was spent discussing the associated minimal risk of  injury to neurovascular structures.  After consent was obtained manipulation was performed as below:            Regions treated:  Per billing codes          Techniques used:  HVLA The patient tolerated the treatment well and reported Improved symptoms following treatment today. Patient was given medications, exercises, stretches and lifestyle modifications per AVS and verbally.     Clinical History: Works a Advertising account plannercompressed air handler  He reports that he has been smoking.  He has never used smokeless tobacco. No results for input(s): HGBA1C, LABURIC in the last 8760 hours.  Objective:  VS:  HT:5\' 11"  (180.3 cm)   WT:170 lb (77.1 kg)  BMI:23.8    BP:(!) 146/87  HR:(!) 104bpm  TEMP: ( )  RESP:  Physical Exam  Constitutional: No distress.  HENT:  Head: Normocephalic and atraumatic.  Eyes: Right eye exhibits no discharge. Left eye exhibits no discharge. No scleral icterus.  Pulmonary/Chest: Effort normal. No respiratory distress.  Neurological: He is alert.  Appropriately interactive.  Skin: Skin is warm and dry. No rash noted. He is not diaphoretic. No erythema. No pallor.  Psychiatric: Judgment normal.    Ortho Exam  Cervical spine: Limited side bending  to the left. Marked TTP over the periscapular region on the right. Negative Spurling's compression test. Upper extremity strength is 5+/5. Upper choice sensation is intact to light touch throughout all upper extremity dermatomes. Normal reflexes.  Imaging: No results found.  Past Medical/Family/Surgical/Social History: Medications & Allergies reviewed per EMR Patient Active Problem List   Diagnosis Date Noted  . 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 Irregular heart beat   . 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 Cancer Maternal Grandfather    Past Surgical History:  Procedure Laterality Date  . CARDIAC ELECTROPHYSIOLOGY STUDY AND ABLATION     Social History   Occupational History  . Not on file.   Social History Main Topics  . Smoking status: Current Every Day Smoker  . Smokeless tobacco: Never Used  . Alcohol use Yes  . Drug use: No  . Sexual activity: Not on file

## 2016-06-27 NOTE — Procedures (Signed)
PROCEDURE NOTE : Osteopathic Exam & Manipulation REGION: OSTEOPATHIC EXAM FINDINGS  THORACIC: T2 - T8 F RISI and N RrSl   The decision today to treat with Osteopathic Manipulative Therapy (OMT) was based on physical exam findings. Verbal consent was obtained after after explanation of risks, benefits and potential side effects, including acute pain flare, post manipulation soreness and need for repeat treatments.  If Cervical manipulation was performed additional time was spent discussing the associated minimal risk of  injury to neurovascular structures.  After consent was obtained manipulation was performed as below:            Regions treated:  Per billing codes          Techniques used:  HVLA The patient tolerated the treatment well and reported Improved symptoms following treatment today. Patient was given medications, exercises, stretches and lifestyle modifications per AVS and verbally.

## 2016-08-08 ENCOUNTER — Ambulatory Visit (INDEPENDENT_AMBULATORY_CARE_PROVIDER_SITE_OTHER): Payer: Self-pay | Admitting: Sports Medicine

## 2016-09-18 ENCOUNTER — Emergency Department (HOSPITAL_COMMUNITY)
Admission: EM | Admit: 2016-09-18 | Discharge: 2016-09-18 | Disposition: A | Discharge status: Home / Self Care | Payer: BLUE CROSS/BLUE SHIELD | Attending: Emergency Medicine | Admitting: Emergency Medicine

## 2016-09-18 ENCOUNTER — Encounter (HOSPITAL_COMMUNITY): Payer: Self-pay

## 2016-09-18 DIAGNOSIS — F17200 Nicotine dependence, unspecified, uncomplicated: Secondary | ICD-10-CM | POA: Insufficient documentation

## 2016-09-18 DIAGNOSIS — G8929 Other chronic pain: Secondary | ICD-10-CM | POA: Insufficient documentation

## 2016-09-18 DIAGNOSIS — Z79899 Other long term (current) drug therapy: Secondary | ICD-10-CM | POA: Diagnosis not present

## 2016-09-18 DIAGNOSIS — M546 Pain in thoracic spine: Secondary | ICD-10-CM | POA: Insufficient documentation

## 2016-09-18 DIAGNOSIS — M549 Dorsalgia, unspecified: Secondary | ICD-10-CM

## 2016-09-18 NOTE — ED Provider Notes (Signed)
[facility] DEPT Provider Note   CSN: [id number] Arrival date & time: 09/18/16  2027   By signing my name below, I, Daryl Harrison, attest that this documentation has been prepared under the direction and in the presence of Daryl Horsemanobert Jenissa Tyrell, PA-C. Electronically Signed: Clarisse GougeXavier Harrison, Scribe. 09/18/16. 9:27 PM.   History   Chief Complaint Chief Complaint  Patient presents with  . Back Pain  . Work Note   The history is provided by the patient and medical records. No language interpreter was used.    HPI Comments: Daryl Harrison is a 26 y.o. male who presents to the Emergency Department complaining of chronic upper back pain that has worsened over the past week. He states his pain radiated to his legs when he sneezed yesterday, which was a new occurrence. He states he was out of work last week, he has an appointment with an orthopedic specialist scheduled in February, and he requests a work note lasting until the appointment. He notes he originally hurt his back at work, and he reports he was diagnosed with a bone spur in the neck 05/2016 close to a nerve. Pt reports associated neck stiffness when waking. He notes he was given gabapentin during the aforementioned 05/2016 visit, but he has run out since, and he currently takes ibuprofen and tylenol with minimal relief. He states he urinates and defecates normally. Hx of tachycardia and childhood arrhythmia.  Had an ablation procedure that was performed on him as a child, reports persistent tachycardia since then. He denies numbness/ weakness.  Past Medical History:  Diagnosis Date  . Irregular heart beat   . 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 CARDIAC ELECTROPHYSIOLOGY STUDY AND ABLATION         Home Medications     Prior to Admission medications   Medication Sig Start Date End Date Taking? Authorizing Provider  gabapentin (NEURONTIN) 300 MG capsule Start with 1 tab po qhs X 1 week, then increase to 1 tab po bid X 1 week then 1 tab po prn 06/27/16   Andrena MewsMichael D Rigby, DO  predniSONE (DELTASONE) 50 MG tablet Take 1 tablet (50 mg total) by mouth daily with breakfast. Start 10/30 06/27/16   Andrena MewsMichael D Rigby, DO    Family History Family History  Problem Relation Age of Onset  . Cancer Maternal Grandfather     Social History Social History  Substance Use Topics  . Smoking status: Current Every Day Smoker  . Smokeless tobacco: Never Used  . Alcohol use Yes     Allergies   Patient has no known allergies.   Review of Systems Review of Systems  Gastrointestinal: Negative for constipation and diarrhea.  Genitourinary: Negative for decreased urine volume, enuresis, frequency and urgency.  Musculoskeletal: Positive for arthralgias, back pain and neck stiffness.  Skin: Negative for wound.  Neurological: Negative for weakness and numbness.  Psychiatric/Behavioral: Negative for confusion.     Physical Exam Updated Vital Signs BP 136/86   Pulse (!) 124   Temp 98.6 F (37 C) (Oral)   Resp 16   Ht 6' (1.829 m)   Wt 173 lb 3 oz (78.6 kg)   SpO2 98%   BMI 23.49 kg/m   Physical Exam Physical Exam  Constitutional: Pt appears  well-developed and well-nourished. No distress.  HENT:  Head: Normocephalic and atraumatic.  Mouth/Throat: Oropharynx is clear and moist. No oropharyngeal exudate.  Eyes: Conjunctivae are normal.  Neck: Normal range of motion. Neck supple.  No meningismus Cardiovascular: Normal rate, regular rhythm and intact distal pulses.   Pulmonary/Chest: Effort normal and breath sounds normal. No respiratory distress. Pt has no wheezes.  Abdominal: Pt exhibits no distension Musculoskeletal:  Moderate cervical paraspinal tender to palpation, no bony CTLS spine tenderness,  deformity, step-off, or crepitus Lymphadenopathy: Pt has no cervical adenopathy.  Neurological: Pt is alert and oriented Speech is clear and goal oriented, follows commands Normal 5/5 strength in upper and lower extremities bilaterally including dorsiflexion and plantar flexion, strong and equal grip strength Sensation intact Great toe extension intact Moves extremities without ataxia, coordination intact Normal gait Normal balance No Clonus Skin: Skin is warm and dry. No rash noted. Pt is not diaphoretic. No erythema.  Psychiatric: Pt has a normal mood and affect. Behavior is normal.  Nursing note and vitals reviewed.   ED Treatments / Results  DIAGNOSTIC STUDIES: Oxygen Saturation is 98% on RA, normal by my interpretation.    COORDINATION OF CARE: 9:27 PM Discussed treatment plan with pt at bedside and pt agreed to plan.  Labs (all labs ordered are listed, but only abnormal results are displayed) Labs Reviewed - No data to display  EKG  EKG Interpretation None       Radiology No results found.  Procedures Procedures (including critical care time)  Medications Ordered in ED Medications - No data to display   Initial Impression / Assessment and Plan / ED Course  I have reviewed the triage vital signs and the nursing notes.  Pertinent labs & imaging results that were available during my care of the patient were reviewed by me and considered in my medical decision making (see chart for details).     Patient with back pain.  No neurological deficits and normal neuro exam.  Patient is ambulatory.  No loss of bowel or bladder control.  Doubt cauda equina.  Denies fever,  doubt epidural abscess or other lesion. Recommend back exercises, stretching, RICE.  Patient reports baseline tachycardia.  In reviewing his charts in [facility], he does appear to always be tachycardic.  He states this is normal for him.  He denies any CP or SOB.    Encouraged the patient that there could  be a need for additional workup and/or imaging such as MRI, if the symptoms do not resolve. Patient advised that if the back pain does not resolve, or radiates, this could progress to more serious conditions and is encouraged to follow-up with PCP or orthopedics within 2 weeks.     Final Clinical Impressions(s) / ED Diagnoses   Final diagnoses:  Other chronic back pain    New Prescriptions New Prescriptions   No medications on file     Daryl Horseman, PA-C 09/18/16 2138    Shaune Pollack, MD 09/19/16 [phone]

## 2016-09-18 NOTE — ED Notes (Signed)
Pt states he is consistently tachycardic d/t chronic cardiac issues. Pt states he has not follow up cardiology.  Pt verbalized understanding of d/c instructions.

## 2016-09-18 NOTE — ED Triage Notes (Signed)
Pt complaining of upper mid back pain x months. Pt states hurt self at work. Pt states has appointment with ortho MD in February. Pt requesting work note until then. Pt states hx tachycardia d/t heart surgery in past. Pt denies any chest pain or SOB. Pt in NAD. Pt ambulatory at triage.

## 2016-10-03 ENCOUNTER — Encounter (INDEPENDENT_AMBULATORY_CARE_PROVIDER_SITE_OTHER): Payer: Self-pay | Admitting: Orthopaedic Surgery

## 2016-10-03 ENCOUNTER — Ambulatory Visit (INDEPENDENT_AMBULATORY_CARE_PROVIDER_SITE_OTHER): Payer: BLUE CROSS/BLUE SHIELD | Admitting: Orthopaedic Surgery

## 2016-10-03 VITALS — BP 145/88 | HR 107 | Ht 72.0 in | Wt 175.0 lb

## 2016-10-03 DIAGNOSIS — M542 Cervicalgia: Secondary | ICD-10-CM | POA: Diagnosis not present

## 2016-10-03 DIAGNOSIS — M47812 Spondylosis without myelopathy or radiculopathy, cervical region: Secondary | ICD-10-CM | POA: Diagnosis not present

## 2016-10-03 NOTE — Addendum Note (Signed)
Addended by: Rogers SeedsYEATTS, Keiondra Brookover M on: 10/03/2016 09:43 AM   Modules accepted: Orders

## 2016-10-03 NOTE — Progress Notes (Signed)
Office Visit Note   Patient: Daryl Harrison           Date of Birth: [birth year]           MRN: [id number] Visit Date: 10/03/2016              Requested by: No referring provider defined for this encounter. PCP: Lobbyist, MD (Inactive)   Assessment & Plan: Visit Diagnoses:  1. Cervicalgia   2. Spondylosis of cervical region without myelopathy or radiculopathy     Plan: Patient failed conservative treatment symptoms were present more than 4 months. He has mechanical nerve root irritation signs. We'll proceed with a cervical MRI and office follow-up after scan. We reviewed x-rays discussed pathophysiology. I reviewed Dr. Janeece Riggers previous conservative treatment. He's had ibuprofen Tylenol prednisone gabapentin without relief. Office follow-up after scan. Patient's pain is been severe enough he's not been able to work the last 2 weeks. Work slip given for no work starting 09/10/2016 and continue for 3 weeks from today so I will enough time to obtain the MRI scan approval, get the MRI scan completed and then review it with him in office follow-up. Follow-Up Instructions: No Follow-up on file.   Orders:  No orders of the defined types were placed in this encounter.  No orders of the defined types were placed in this encounter.     Procedures: No procedures performed   Clinical Data: No additional findings.   Subjective: Chief Complaint  Patient presents with  . Neck - Pain  . Left Shoulder - Pain    Patient presents with left sided neck pain that radiates down under left shoulder blade. He denies any radiculopathy down either arm. He saw Dr. Berline Chough in October and states he was diagnosed with a bone spur in his neck. The pain has just gotten worse. He states that when he wakes up, he cannot hardly move his neck. He has been out of work x 2 weeks waiting on this appointment due to pain. He is using ibuprofen/tylenol with no relief. He states that Dr. Berline Chough tried him on  Gabapentin as well which did not help his symptoms.   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 His neck bothers him on a daily basis. Patient's been on the prednisone pack without relief in October.  Review of Systems  Constitutional: Negative for chills and diaphoresis.  HENT: Negative for ear discharge, ear pain and nosebleeds.   Eyes: Negative for discharge and visual disturbance.  Respiratory: Negative for cough, choking and shortness of breath.   Cardiovascular: Negative for chest pain and palpitations.       History of tachycardia. He had an ablation done when he was about 50 or 26 years old he believes. No problems since that time. He is on no cardiac meds.  Gastrointestinal: Negative for abdominal distention and abdominal pain.  Endocrine: Negative for cold intolerance and heat intolerance.  Genitourinary: Negative for flank pain and hematuria.  Musculoskeletal:       Neck pain times greater than 4 months. Pain radiates more to the left side sometimes down his hand. Bothers him a daily basis. He's tried anti-inflammatories, he's been on Neurontin without relief. He states gradually in the last 2 months his symptoms of gotten worse.  Skin: Negative for rash and wound.  Neurological: Negative  for seizures and speech difficulty.  Hematological: Negative for adenopathy. Does not bruise/bleed easily.  Psychiatric/Behavioral: Negative for agitation and suicidal ideas.   Family history: Patient several family members who've had back problems neck problems.  Objective: Vital Signs: BP (!) 145/88   Pulse (!) 107   Ht 6' (1.829 m)   Wt 175 lb (79.4 kg)   BMI 23.73 kg/m   Physical Exam  Constitutional: He is oriented to person, place, and time. He appears well-developed and well-nourished.  HENT:  Head: Normocephalic and atraumatic.    Eyes: EOM are normal. Pupils are equal, round, and reactive to light.  Neck: No tracheal deviation present. No thyromegaly present.  Cardiovascular: Normal rate.   Pulmonary/Chest: Effort normal. He has no wheezes.  Abdominal: Soft. Bowel sounds are normal.  Musculoskeletal:  Patient has a significant brachial plexus tenderness positive spurring on the left negative Lhermitte he has pain with full flexion tendon chest increased pain with compression. Upper extremity reflexes biceps triceps brachioradialis are 1+ and symmetrical. Negative impingement right and left shoulders. No isolated motor weakness biceps triceps, wrist flexion extension and grip. No lower extremity hyperreflexia.  Neurological: He is alert and oriented to person, place, and time.  Skin: Skin is warm and dry. Capillary refill takes less than 2 seconds.  Psychiatric: He has a normal mood and affect. His behavior is normal. Judgment and thought content normal.    Ortho Exam  Specialty Comments:  Works a Advertising account plannercompressed air handler  Imaging: No results found.   PMFS History: Patient Active Problem List   Diagnosis Date Noted  . 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 Irregular heart beat   . 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 Cancer Maternal Grandfather     Past Surgical History:  Procedure Laterality Date  . CARDIAC ELECTROPHYSIOLOGY STUDY AND ABLATION     Social History   Occupational History  . Not on file.   Social History Main Topics  . Smoking status: Current Every Day Smoker  . Smokeless tobacco: Never Used  . Alcohol use Yes  . Drug use: No  . Sexual activity: Not on file

## 2016-10-05 ENCOUNTER — Ambulatory Visit (INDEPENDENT_AMBULATORY_CARE_PROVIDER_SITE_OTHER): Payer: Self-pay | Admitting: Orthopaedic Surgery

## 2016-10-07 ENCOUNTER — Ambulatory Visit
Admission: RE | Admit: 2016-10-07 | Discharge: 2016-10-07 | Disposition: A | Discharge status: Home / Self Care | Payer: BLUE CROSS/BLUE SHIELD | Source: Ambulatory Visit | Attending: Orthopaedic Surgery | Admitting: Orthopaedic Surgery

## 2016-10-07 DIAGNOSIS — M47812 Spondylosis without myelopathy or radiculopathy, cervical region: Secondary | ICD-10-CM

## 2016-10-07 DIAGNOSIS — M542 Cervicalgia: Secondary | ICD-10-CM

## 2016-10-18 ENCOUNTER — Telehealth (INDEPENDENT_AMBULATORY_CARE_PROVIDER_SITE_OTHER): Payer: Self-pay | Admitting: Orthopaedic Surgery

## 2016-10-18 NOTE — Telephone Encounter (Signed)
I called and discussed. Normal MRI

## 2016-10-18 NOTE — Telephone Encounter (Signed)
Please advise on work note.

## 2016-10-18 NOTE — Telephone Encounter (Signed)
Patient called needing a medical release. Also was only written a note to be out of work for 3 weeks. Needing an extension from now til around March 16th when his next appointment is. CB # [phone]

## 2016-10-20 ENCOUNTER — Telehealth (INDEPENDENT_AMBULATORY_CARE_PROVIDER_SITE_OTHER): Payer: Self-pay | Admitting: *Deleted

## 2016-10-20 NOTE — Telephone Encounter (Signed)
Fix note  States OK for work  Starting 10/27/16.   He can come by and pick up.

## 2016-10-20 NOTE — Telephone Encounter (Signed)
Please advise on work note.

## 2016-10-20 NOTE — Telephone Encounter (Signed)
This is a [facility] patient 

## 2016-10-20 NOTE — Telephone Encounter (Signed)
Patient called in this morning in regards to needing a note for work to be taken out until March 16th. His CB # [phone]. Thank you

## 2016-10-20 NOTE — Telephone Encounter (Signed)
Note fixed and at front desk in [place] for pick up.

## 2016-10-24 ENCOUNTER — Telehealth (INDEPENDENT_AMBULATORY_CARE_PROVIDER_SITE_OTHER): Payer: Self-pay | Admitting: Orthopaedic Surgery

## 2016-10-24 NOTE — Telephone Encounter (Signed)
Please advise 

## 2016-10-24 NOTE — Telephone Encounter (Signed)
Patient is requesting injections to subside the pain so he is able to work.  Cb#: [phone]

## 2016-10-24 NOTE — Telephone Encounter (Signed)
I called discussed. He can get TENS unit OTC , cervical MRI showed no compression.

## 2016-11-11 ENCOUNTER — Ambulatory Visit (INDEPENDENT_AMBULATORY_CARE_PROVIDER_SITE_OTHER): Payer: BLUE CROSS/BLUE SHIELD | Admitting: Orthopaedic Surgery

## 2016-11-15 ENCOUNTER — Ambulatory Visit (INDEPENDENT_AMBULATORY_CARE_PROVIDER_SITE_OTHER): Payer: BLUE CROSS/BLUE SHIELD | Admitting: Orthopaedic Surgery

## 2018-08-19 IMAGING — MR MR CERVICAL SPINE W/O CM
4 of 7 series · 23 of 48 positions shown · non-contrast
Comparison: None.

CLINICAL DATA: Neck and back pain.  Radiates to the left arm.

EXAM:
MRI CERVICAL SPINE WITHOUT CONTRAST
TECHNIQUE: Multiplanar, multisequence MR imaging of the cervical spine was
performed. No intravenous contrast was administered.

[Series 3: T2 post-contrast · sagittal · 3.5mm · 0.35mm/px · 4 of 12 slices shown]
[im 1/12]
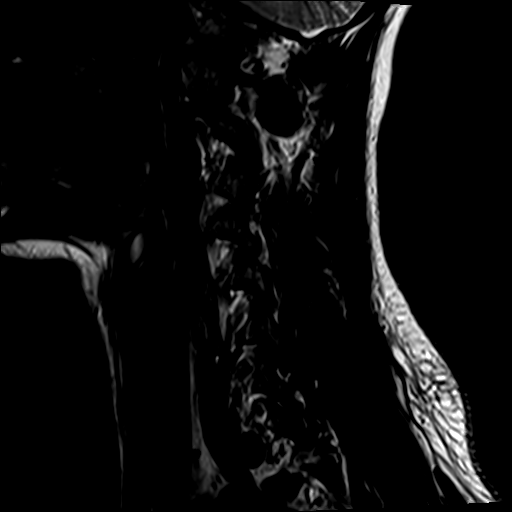
[im 4/12]
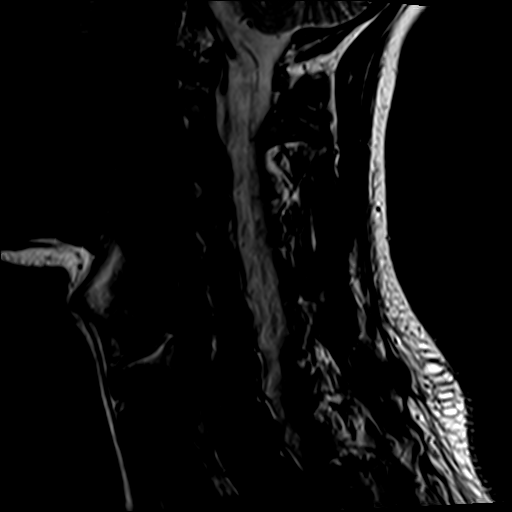
[im 8/12]
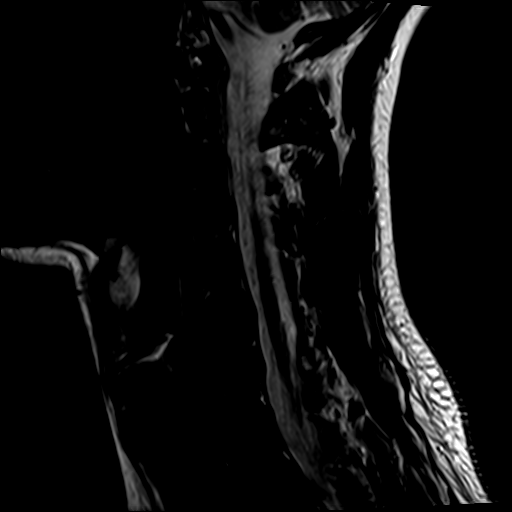
[im 12/12]
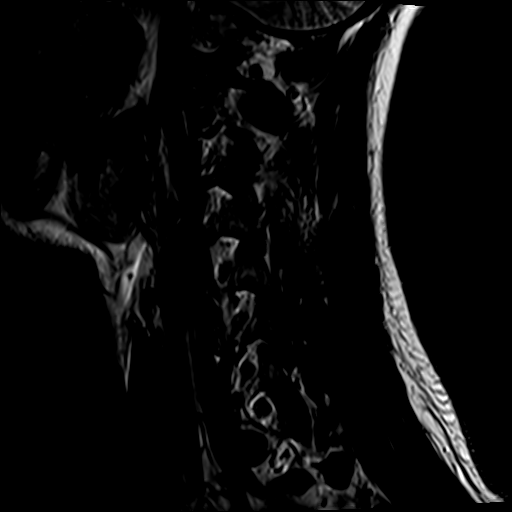

[Series 4: T1 · sagittal · 3.5mm · 0.35mm/px · 4 of 12 slices shown (1 of 2)]
[im 1/12]
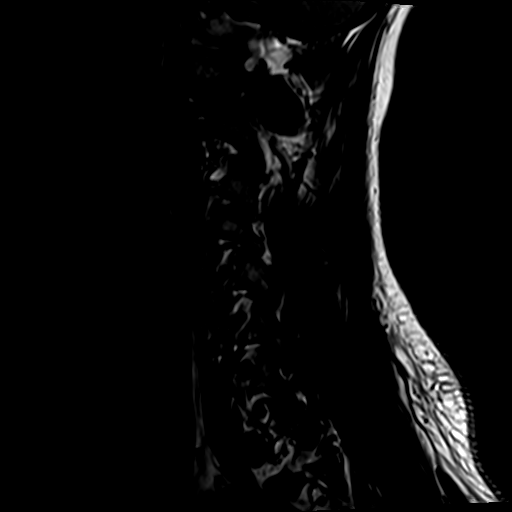
[im 4/12]
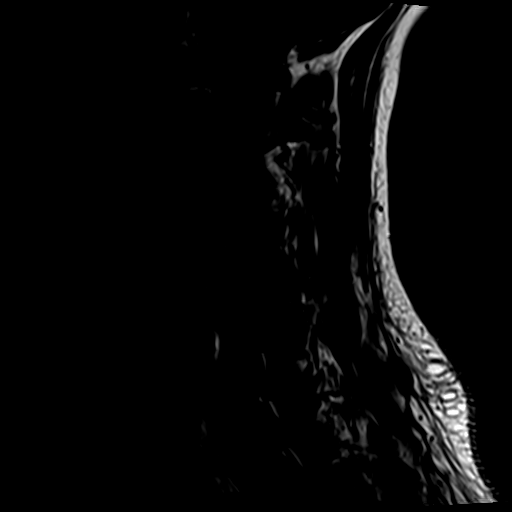
[im 8/12]
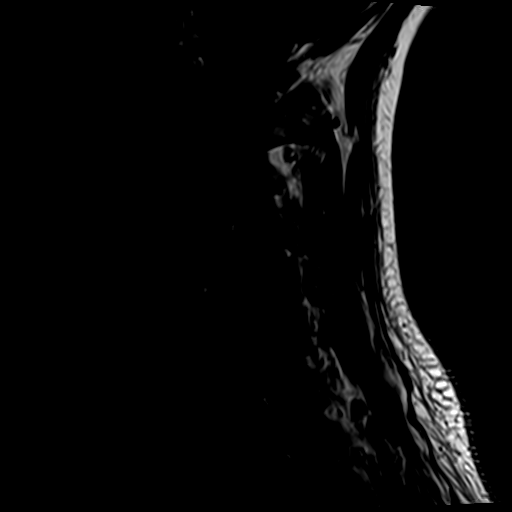
[im 12/12]
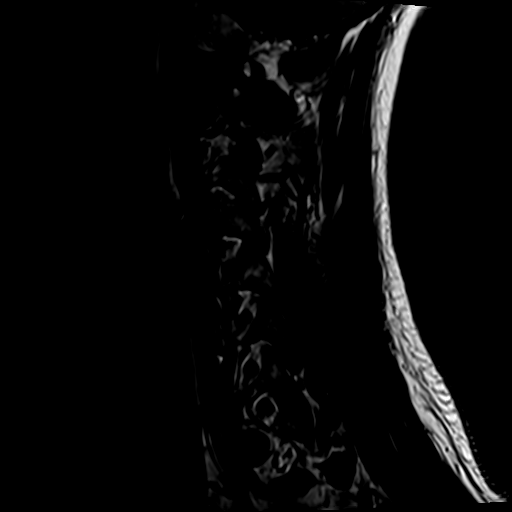

[Series 5: T2 · axial · 3.0mm · 0.39mm/px · z∈[-58,+41]mm · 9 of 30 slices shown]
[im 1/30]
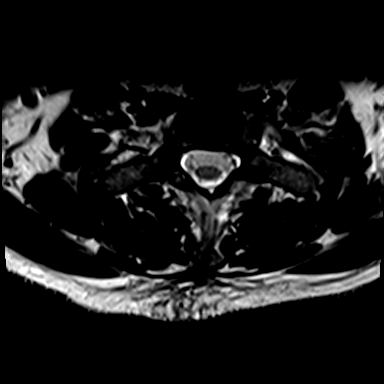
[im 4/30]
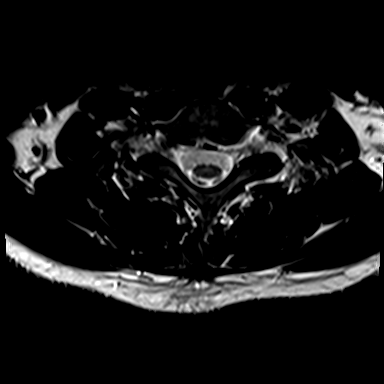
[im 8/30]
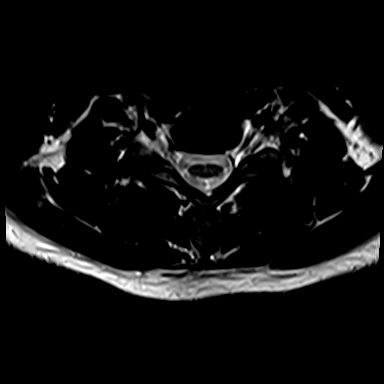
[im 11/30]
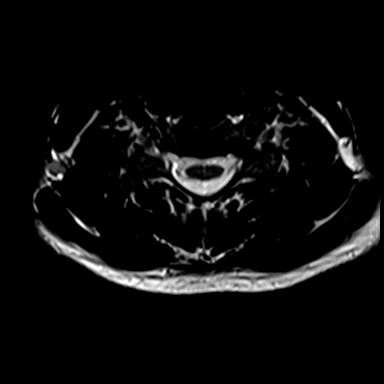
[im 15/30]
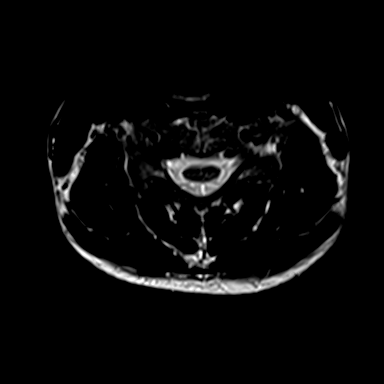
[im 19/30]
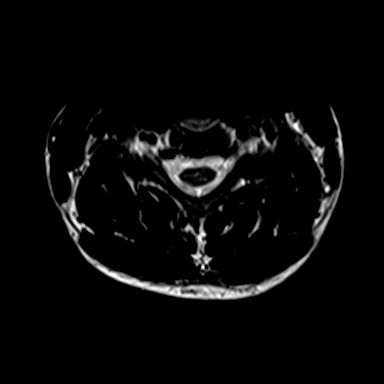
[im 22/30]
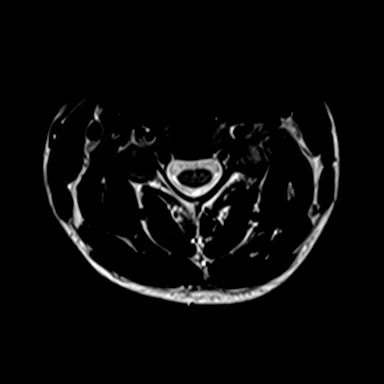
[im 26/30]
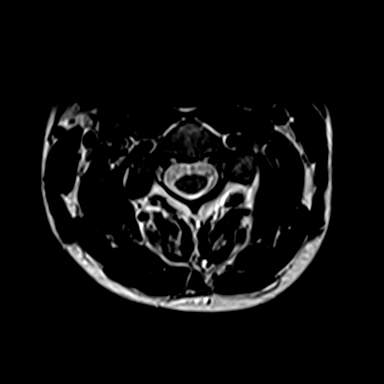
[im 30/30]
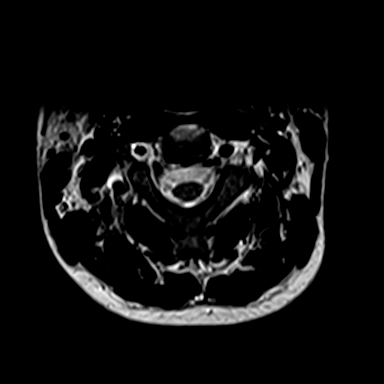

[Series 7: T1 · axial · 3.0mm · 0.35mm/px · z∈[-61,+24]mm · 6 of 30 slices shown (2 of 2)]
[im 1/30]
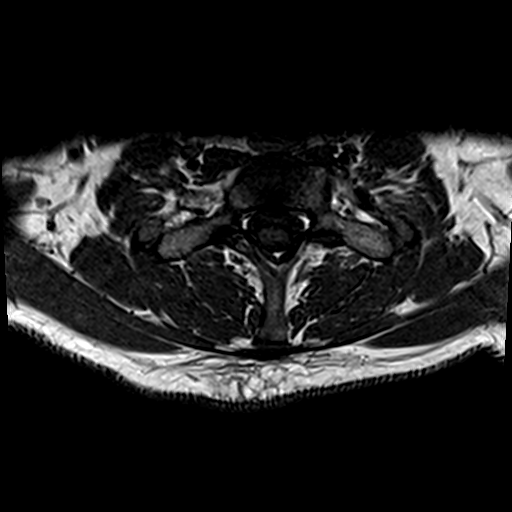
[im 4/30]
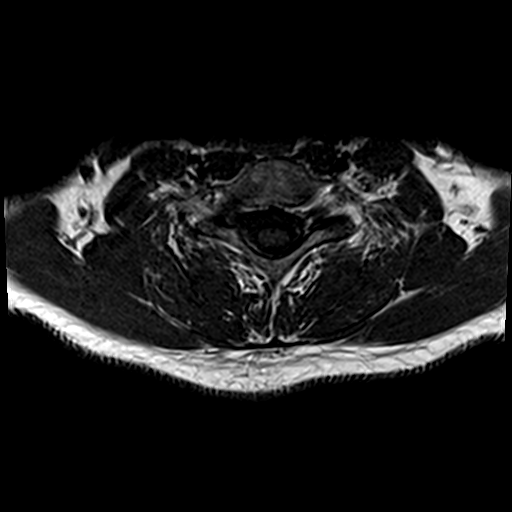
[im 8/30]
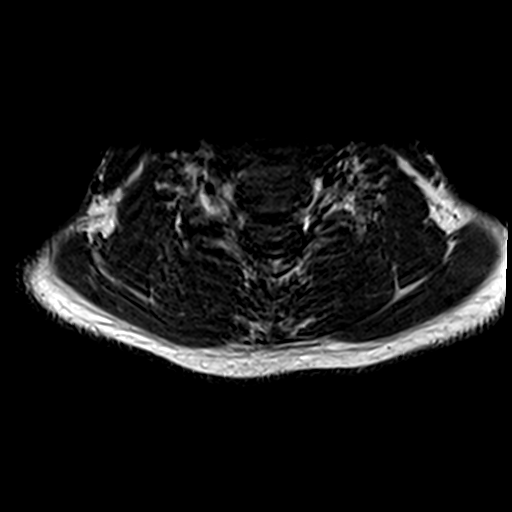
[im 11/30]
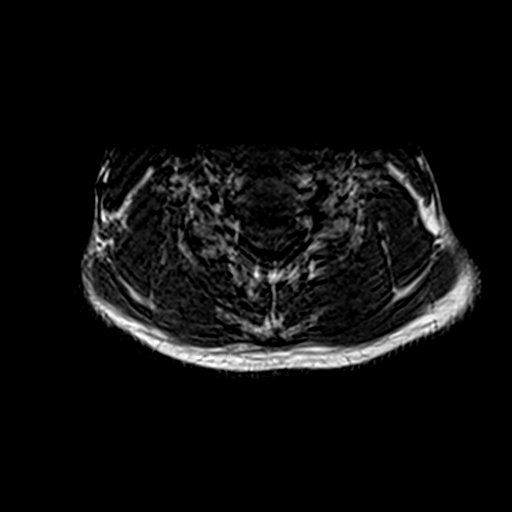
[im 15/30]
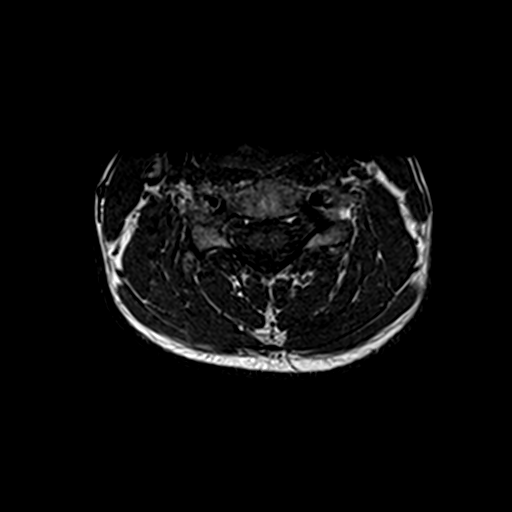
[im 26/30]
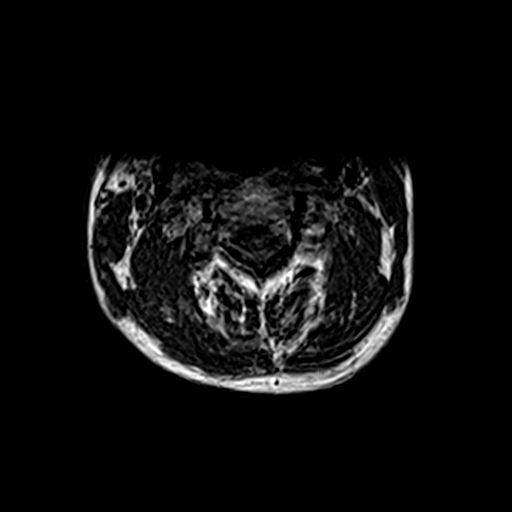

[23 of 48 positions shown; findings below may reference images not displayed]

FINDINGS: Alignment: Physiologic.

Vertebrae: No fracture, evidence of discitis, or bone lesion.

Cord: Normal signal and morphology.

Posterior Fossa, vertebral arteries, paraspinal tissues: Negative.

Disc levels:

Discs: Disc heights are maintained.

C2-3: No significant disc bulge. No neural foraminal stenosis. No
central canal stenosis.

C3-4: Minimal broad-based disc bulge. No neural foraminal stenosis.
No central canal stenosis.

C4-5: No significant disc bulge. No neural foraminal stenosis. No
central canal stenosis.

C5-6: Minimal broad-based disc bulge. No neural foraminal stenosis.
No central canal stenosis.

C6-7: Minimal broad-based disc bulge. No neural foraminal stenosis.
No central canal stenosis.

C7-T1: No significant disc bulge. No neural foraminal stenosis. No
central canal stenosis.
IMPRESSION: 1. No significant cervical spine disc protrusion, foraminal stenosis
or central canal stenosis.
2. No focal abnormality of the cervical spinal cord.
